# Patient Record
Sex: Male | Born: 1990 | Race: Black or African American | Hispanic: No | Marital: Single | Smoking: Never smoker
Health system: Southern US, Community
[De-identification: ages and names within clinical notes are randomized; demographics above are authoritative.]

## PROBLEM LIST (undated history)

## (undated) DIAGNOSIS — F329 Major depressive disorder, single episode, unspecified: Secondary | ICD-10-CM

## (undated) DIAGNOSIS — J45909 Unspecified asthma, uncomplicated: Secondary | ICD-10-CM

## (undated) DIAGNOSIS — F32A Depression, unspecified: Secondary | ICD-10-CM

## (undated) DIAGNOSIS — F419 Anxiety disorder, unspecified: Secondary | ICD-10-CM

---

## 2014-10-24 ENCOUNTER — Emergency Department: Payer: BLUE CROSS/BLUE SHIELD

## 2014-10-24 ENCOUNTER — Encounter: Payer: Self-pay | Admitting: Emergency Medicine

## 2014-10-24 ENCOUNTER — Emergency Department
Admission: EM | Admit: 2014-10-24 | Discharge: 2014-10-24 | Disposition: A | Discharge status: Home / Self Care | Payer: BLUE CROSS/BLUE SHIELD | Attending: Emergency Medicine | Admitting: Emergency Medicine

## 2014-10-24 DIAGNOSIS — R112 Nausea with vomiting, unspecified: Secondary | ICD-10-CM | POA: Diagnosis present

## 2014-10-24 DIAGNOSIS — R1013 Epigastric pain: Secondary | ICD-10-CM

## 2014-10-24 DIAGNOSIS — Z79899 Other long term (current) drug therapy: Secondary | ICD-10-CM | POA: Diagnosis not present

## 2014-10-24 HISTORY — DX: Depression, unspecified: F32.A

## 2014-10-24 HISTORY — DX: Unspecified asthma, uncomplicated: J45.909

## 2014-10-24 HISTORY — DX: Anxiety disorder, unspecified: F41.9

## 2014-10-24 HISTORY — DX: Major depressive disorder, single episode, unspecified: F32.9

## 2014-10-24 LAB — COMPREHENSIVE METABOLIC PANEL
ALK PHOS: 51 U/L (ref 38–126)
ALT: 19 U/L (ref 17–63)
AST: 26 U/L (ref 15–41)
Albumin: 4.4 g/dL (ref 3.5–5.0)
Anion gap: 6 (ref 5–15)
BUN: 13 mg/dL (ref 6–20)
CALCIUM: 9.2 mg/dL (ref 8.9–10.3)
CO2: 29 mmol/L (ref 22–32)
CREATININE: 1.08 mg/dL (ref 0.61–1.24)
Chloride: 103 mmol/L (ref 101–111)
GFR calc non Af Amer: >60 mL/min (ref >60)
GLUCOSE: 95 mg/dL (ref 65–99)
Potassium: 3.8 mmol/L (ref 3.5–5.1)
SODIUM: 138 mmol/L (ref 135–145)
Total Bilirubin: 0.7 mg/dL (ref 0.3–1.2)
Total Protein: 7.3 g/dL (ref 6.5–8.1)

## 2014-10-24 LAB — CBC
HCT: 40.2 % (ref 40.0–52.0)
Hemoglobin: 13.2 g/dL (ref 13.0–18.0)
MCH: 30.9 pg (ref 26.0–34.0)
MCHC: 32.9 g/dL (ref 32.0–36.0)
MCV: 94.1 fL (ref 80.0–100.0)
PLATELETS: 219 10*3/uL (ref 150–440)
RBC: 4.27 MIL/uL — AB (ref 4.40–5.90)
RDW: 13.2 % (ref 11.5–14.5)
WBC: 5.2 10*3/uL (ref 3.8–10.6)

## 2014-10-24 LAB — LIPASE, BLOOD: Lipase: 17 U/L — ABNORMAL LOW (ref 22–51)

## 2014-10-24 MED ORDER — IOHEXOL 240 MG/ML SOLN
25.0000 mL | Freq: Once | INTRAMUSCULAR | Status: AC | PRN
  Administered 2014-10-24: 25 mL via ORAL

## 2014-10-24 MED ORDER — GI COCKTAIL ~~LOC~~
30.0000 mL | Freq: Once | ORAL | Status: AC
  Administered 2014-10-24: 30 mL via ORAL
  Filled 2014-10-24: qty 30

## 2014-10-24 MED ORDER — ONDANSETRON 4 MG PO TBDP
4.0000 mg | ORAL_TABLET | Freq: Once | ORAL | Status: AC
  Administered 2014-10-24: 4 mg via ORAL
  Filled 2014-10-24: qty 1

## 2014-10-24 MED ORDER — IOHEXOL 300 MG/ML  SOLN
100.0000 mL | Freq: Once | INTRAMUSCULAR | Status: AC | PRN
  Administered 2014-10-24: 100 mL via INTRAVENOUS

## 2014-10-24 MED ORDER — ONDANSETRON 4 MG PO TBDP: 4.0000 mg | ORAL_TABLET | Freq: Four times a day (QID) | ORAL | Status: AC | PRN

## 2014-10-24 NOTE — Discharge Instructions (Signed)
Please follow up closely with your primary doctor and with gastroenterology. Please return to the emergency room right away if you develop a fever, severe or persistent abdominal pain, or vomiting and feel you cannot keep food down, you have bloody diarrhea or vomit, or other new concerns or symptoms arise. I strongly recommend close follow-up with gastroenterology. Also, please start taking Nexium over-the-counter as directed on the box.  Abdominal Pain Many things can cause abdominal pain. Usually, abdominal pain is not caused by a disease and will improve without treatment. It can often be observed and treated at home. Your health care provider will do a physical exam and possibly order blood tests and X-rays to help determine the seriousness of your pain. However, in many cases, more time must pass before a clear cause of the pain can be found. Before that point, your health care provider may not know if you need more testing or further treatment. HOME CARE INSTRUCTIONS  Monitor your abdominal pain for any changes. The following actions may help to alleviate any discomfort you are experiencing:  Only take over-the-counter or prescription medicines as directed by your health care provider.  Do not take laxatives unless directed to do so by your health care provider.  Try a clear liquid diet (broth, tea, or water) as directed by your health care provider. Slowly move to a bland diet as tolerated. SEEK MEDICAL CARE IF:  You have unexplained abdominal pain.  You have abdominal pain associated with nausea or diarrhea.  You have pain when you urinate or have a bowel movement.  You experience abdominal pain that wakes you in the night.  You have abdominal pain that is worsened or improved by eating food.  You have abdominal pain that is worsened with eating fatty foods.  You have a fever. SEEK IMMEDIATE MEDICAL CARE IF:   Your pain does not go away within 2 hours.  You keep throwing up  (vomiting).  Your pain is felt only in portions of the abdomen, such as the right side or the left lower portion of the abdomen.  You pass bloody or black tarry stools. MAKE SURE YOU:  Understand these instructions.   Will watch your condition.   Will get help right away if you are not doing well or get worse.  Document Released: 11/08/2004 Document Revised: 02/03/2013 Document Reviewed: 10/08/2012 [facility] Patient Information 2015 [place], [place]. This information is not intended to replace advice given to you by your health care provider. Make sure you discuss any questions you have with your health care provider.

## 2014-10-24 NOTE — ED Notes (Signed)
Patient to ED with report of nausea/vomiting off and on that began after he had been having some issues with reflux type symptoms. Reports nausea/vomiting symptoms for about a month.

## 2014-10-24 NOTE — ED Provider Notes (Addendum)
[facility] Emergency Department Provider Note REMINDER - THIS NOTE IS NOT A FINAL MEDICAL RECORD UNTIL IT IS SIGNED. UNTIL THEN, THE CONTENT BELOW MAY REFLECT INFORMATION FROM A DOCUMENTATION TEMPLATE, NOT THE ACTUAL PATIENT VISIT. ____________________________________________  Time seen: Approximately 9:10 PM  I have reviewed the triage vital signs and the nursing notes.   HISTORY  Chief Complaint Emesis and Gastrophageal Reflux    HPI Pedro Johnson is a 24 y.o. male history of anxiety and depression. Patient reports since July 4 he's been having pain in the upper abdomen that comes and goes. He states it is sometimes worse with eating, feels like a crampy sensation. He reports that he has had severe nausea and has vomited at times with this, and reports he thinks he has reflux with burning feeling in the upper abdomen after vomiting. He reports he has not vomited in a few days, he is eating and drinking okay but continues to have intermittent nausea.  Denies previous significant history. Denies recent trauma. No fevers or chills. No diarrhea. No bloody or black emesis.   Past Medical History  Diagnosis Date  . Asthma   . Depression   . Anxiety     There are no active problems to display for this patient.   History reviewed. No pertinent past surgical history.  Current Outpatient Rx  Name  Route  Sig  Dispense  Refill  . albuterol (PROVENTIL HFA;VENTOLIN HFA) 108 (90 BASE) MCG/ACT inhaler   Inhalation   Inhale 2 puffs into the lungs every 6 (six) hours as needed for wheezing or shortness of breath.         Marland Kitchen buPROPion (WELLBUTRIN XL) 300 MG 24 hr tablet   Oral   Take 300 mg by mouth every morning.      1   . clonazePAM (KLONOPIN) 0.5 MG tablet   Oral   Take 0.5 mg by mouth 3 (three) times daily.      1   . escitalopram (LEXAPRO) 5 MG tablet   Oral   Take 5 mg by mouth daily.      0   . gabapentin (NEURONTIN) 300 MG capsule   Oral   Take 300 mg by mouth 2 (two) times daily.      0   . ondansetron (ZOFRAN ODT) 4 MG disintegrating tablet   Oral   Take 1 tablet (4 mg total) by mouth every 6 (six) hours as needed for nausea or vomiting.   20 tablet   0     Allergies Sulfa antibiotics  History reviewed. No pertinent family history.  Social History Social History  Substance Use Topics  . Smoking status: Never Smoker   . Smokeless tobacco: None  . Alcohol Use: Yes    Review of Systems Constitutional: No fever/chills Eyes: No visual changes. ENT: No sore throat. Cardiovascular: Denies chest pain. Respiratory: Denies shortness of breath. Gastrointestinal:   No diarrhea.  No constipation. No bloody stools. Genitourinary: Negative for dysuria. Musculoskeletal: Negative for back pain. No flank pain. Skin: Negative for rash. Neurological: Negative for headaches, focal weakness or numbness.  10-point ROS otherwise negative.  ____________________________________________   PHYSICAL EXAM:  VITAL SIGNS: ED Triage Vitals  Enc Vitals Group     BP 10/24/14 1809 132/85 mmHg     Pulse Rate 10/24/14 1809 72     Resp 10/24/14 1809 20     Temp 10/24/14 1809 98.6 F (37 C)     Temp Source 10/24/14 1809 Oral  SpO2 10/24/14 1809 99 %     Weight 10/24/14 1809 200 lb (90.719 kg)     Height 10/24/14 1809 6' (1.829 m)     Head Cir --      Peak Flow --      Pain Score 10/24/14 1810 10     Pain Loc --      Pain Edu? --      Excl. in GC? --    Constitutional: Alert and oriented. Well appearing and in no acute distress. Well built. Very amicable and in no distress. Eyes: Conjunctivae are normal. PERRL. EOMI. Head: Atraumatic. Nose: No congestion/rhinnorhea. Mouth/Throat: Mucous membranes are moist.  Oropharynx non-erythematous. Neck: No stridor.   Cardiovascular: Normal rate, regular rhythm. Grossly normal heart sounds.  Good peripheral circulation. Respiratory: Normal respiratory effort.  No retractions.  Lungs CTAB. Gastrointestinal: Soft and nontender except for some mild discomfort in the epigastrium. No distention. No abdominal bruits. No CVA tenderness. No pain in McBurney's point. Negative Murphy. Musculoskeletal: No lower extremity tenderness nor edema.  No joint effusions. Neurologic:  Normal speech and language. No gross focal neurologic deficits are appreciated. No gait instability. Skin:  Skin is warm, dry and intact. No rash noted. Psychiatric: Mood and affect are normal. Speech and behavior are normal.  ____________________________________________   LABS (all labs ordered are listed, but only abnormal results are displayed)  Labs Reviewed  LIPASE, BLOOD - Abnormal; Notable for the following:    Lipase 17 (*)    All other components within normal limits  CBC - Abnormal; Notable for the following:    RBC 4.27 (*)    All other components within normal limits  COMPREHENSIVE METABOLIC PANEL   ____________________________________________  EKG  ED ECG REPORT I, Michi Herrmann, the attending physician, personally viewed and interpreted this ECG.  Date: 10/24/2014 EKG Time: 2030 Rate: 70 Rhythm: normal sinus rhythm QRS Axis: normal Intervals: normal ST/T Wave abnormalities: normal Conduction Disutrbances: Right bundle branch block incomplete Narrative Interpretation: unremarkable except for incomplete right bundle-branch block. No ischemic change.  ____________________________________________  RADIOLOGY  Ultrasound right upper quadrant pending at time of sign out to Dr. Langston Masker ____________________________________________   PROCEDURES  Procedure(s) performed: None  Critical Care performed: No  ____________________________________________   INITIAL IMPRESSION / ASSESSMENT AND PLAN / ED COURSE  Pertinent labs & imaging results that were available during my care of the patient were reviewed by me and considered in my medical decision making (see chart for  details).  Patient presents with 3+ months of nausea with occasional vomiting. His exam is very reassuring. He has no acute cardiac or pulmonary symptoms. He is afebrile and in no distress. His abdominal exam is essentially normal except for some mild discomfort to palpation in the deep epigastrium. He does have reflux type symptoms. Because of the intermittent nature and epigastric location I have ordered an ultrasound to evaluate for cholelithiasis. Should this be negative, I discussed with the patient treatment fever over-the-counter Nexium and prescription Zofran with close follow-up advised with his primary care doctor and gastroenterology. We did discuss CT scan to rule out additional causes such as very unlikely but not impossible tumor, or infection though I highly doubt it based on exam. With shared medical decision making and discussions of the risks of radiation which are very low, but not 0 patient does not wish to have this but instead will follow-up closely with good return precautions.  Care and disposition assigned to Dr. Langston Masker. He will follow-up on ultrasound,  should this be negative and is to discharge the patient to follow-up with gastroenterology.  I advised the patient on careful abdominal pain return precautions including any fever, worsening of pain, bloody emesis, dehydration, other new concerns. ____________________________________________   FINAL CLINICAL IMPRESSION(S) / ED DIAGNOSES  Final diagnoses:  Epigastric pain      Sharyn Creamer, MD 10/24/14 2114  ----------------------------------------- 9:35 PM on 10/24/2014 -----------------------------------------  The patient is adamant that he needs a CT scan, and I discussed the risks and benefits with him extensively. He reports that his mother is a physician in [place], and that he must have a CT scan today. Based on the patient's significant request, I have ordered a CT and canceled his ultrasound. Dr. Langston Masker of  follow-up on CT of pelvis. Risks of radiation, IV contrast, allergic reaction, future cancers all discussed with the patient to his excepting.  Sharyn Creamer, MD 10/24/14 2136

## 2014-10-24 NOTE — ED Provider Notes (Signed)
Received signout from Dr. Fanny Bien. Plan is to follow up with the patient's CAT scan of his abdomen and pelvis. Patient has a history of reflux and of the skin is negative the patient likely has symptoms related to this. Physical Exam  BP 134/72 mmHg  Pulse 71  Temp(Src) 98.6 F (37 C) (Oral)  Resp 20  Ht 6' (1.829 m)  Wt 200 lb (90.719 kg)  BMI 27.12 kg/m2  SpO2 100% ----------------------------------------- 10:39 PM on 10/24/2014 -----------------------------------------   Physical Exam Patient resting comfortable with this time. Abdomen is soft with minimal tenderness in the epigastrium. There is a negative Murphy sign. ED Course  Procedures No acute abnormality in the abdomen and pelvis on CAT scan. MDM Patient well appearing and tolerated his by mouth contrast. Abdomen reexamined with a nonsurgical abdomen with only minimal tenderness. We'll discharge to home.      Myrna Blazer, MD 10/24/14 2240

## 2014-10-24 NOTE — ED Notes (Signed)
Lab results reviewed and WNL. Awaiting room for MD eval at this time.

## 2014-10-24 NOTE — ED Notes (Signed)
Pt has upper abd pain.  Pt reports n/v everyday since July 4.  Denies diarrhea.  No back pain.

## 2017-03-03 IMAGING — CT CT ABD-PELV W/ CM
1 of 2 series · 15 of 32 positions shown, 19 images · IV contrast (omnipaque)
Comparison: None.

CLINICAL DATA: Epigastric pain, nausea and anorexia for 3 months.
Vomiting.

EXAM:
CT ABDOMEN AND PELVIS WITH CONTRAST
TECHNIQUE: Multidetector CT imaging of the abdomen and pelvis was performed
using the standard protocol following bolus administration of
intravenous contrast.
CONTRAST:  100mL OMNIPAQUE IOHEXOL 300 MG/ML  SOLN

[Series 2: routine abd pel with · axial · 0.72mm/px · z∈[-500,-96]mm · 15 of 89 slices shown, 19 images]
[im 4/89  soft-tissue]
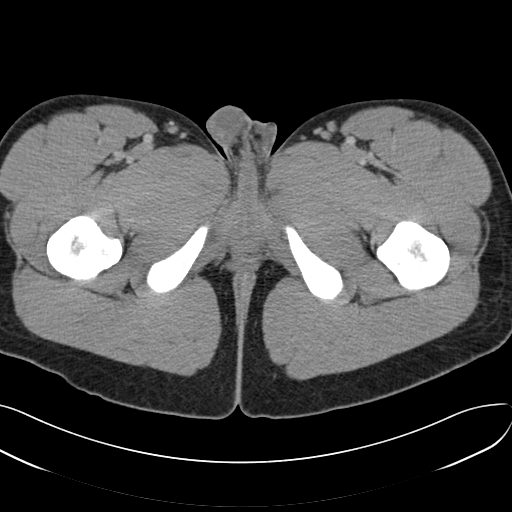
[im 4/89  bone]
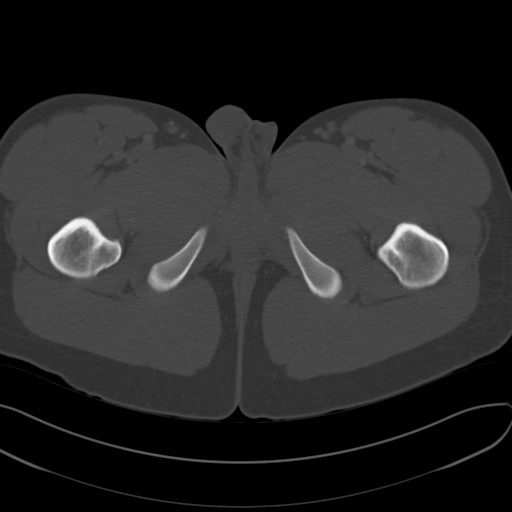
[im 12/89  soft-tissue]
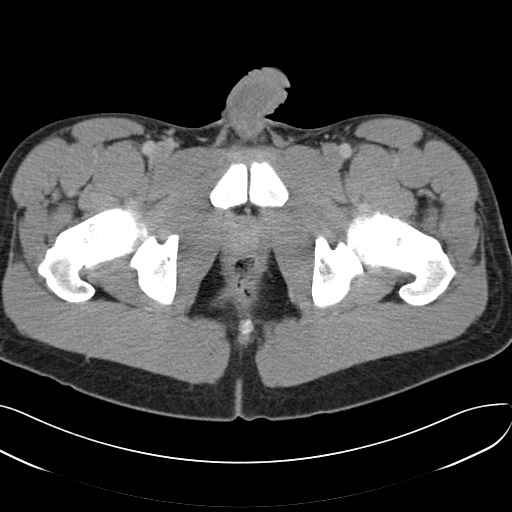
[im 19/89  soft-tissue]
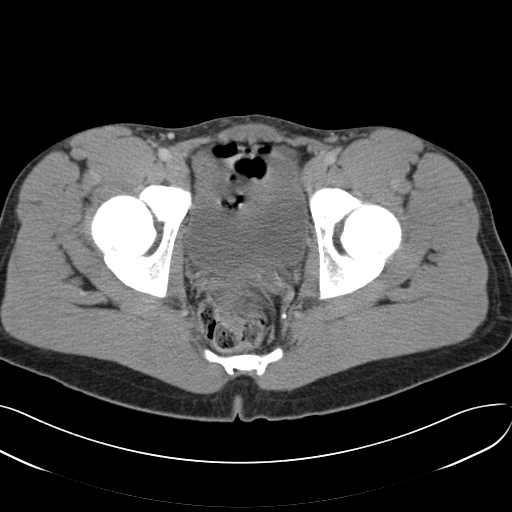
[im 26/89  soft-tissue]
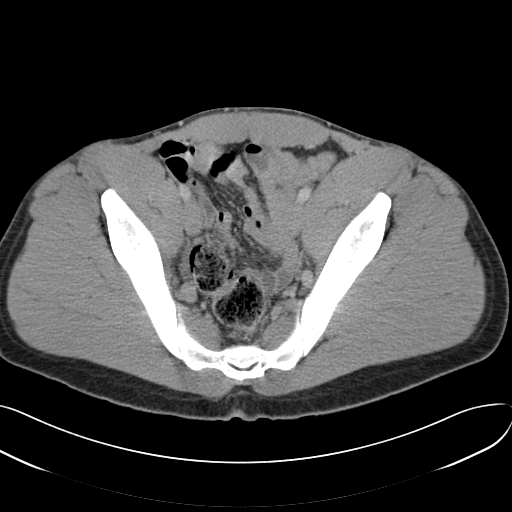
[im 30/89  soft-tissue]
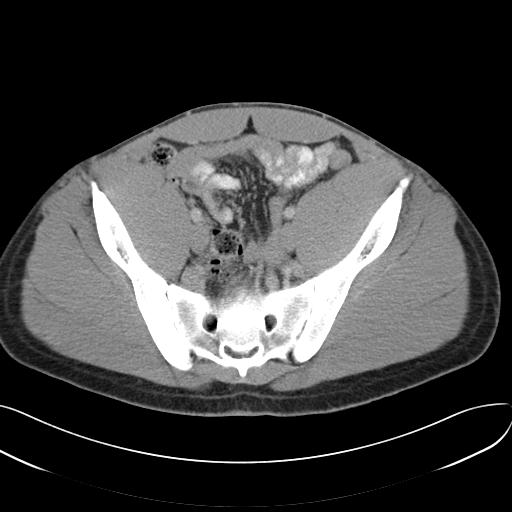
[im 37/89  soft-tissue]
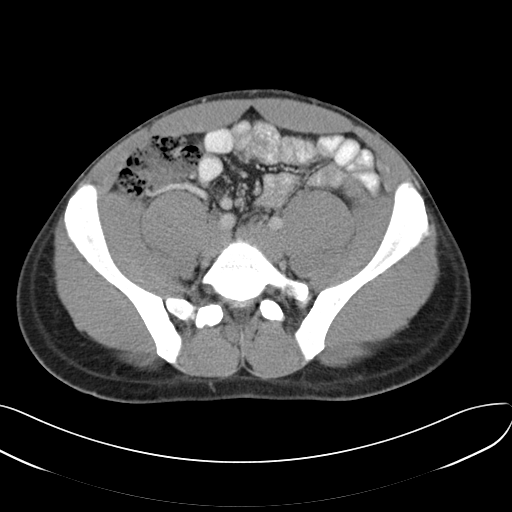
[im 45/89  soft-tissue]
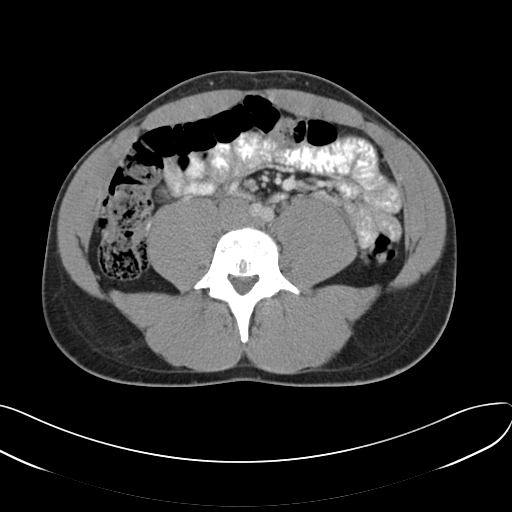
[im 52/89  soft-tissue]
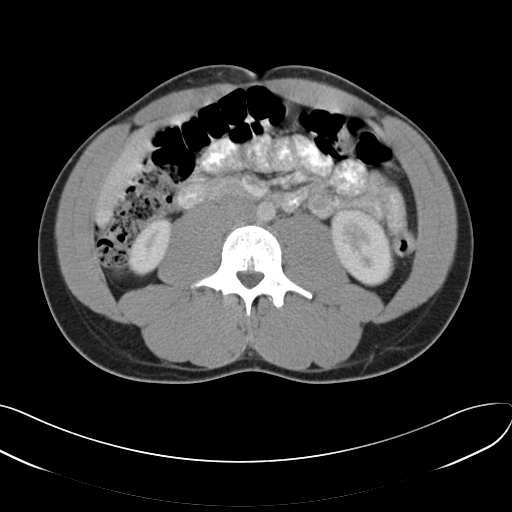
[im 59/89  soft-tissue]
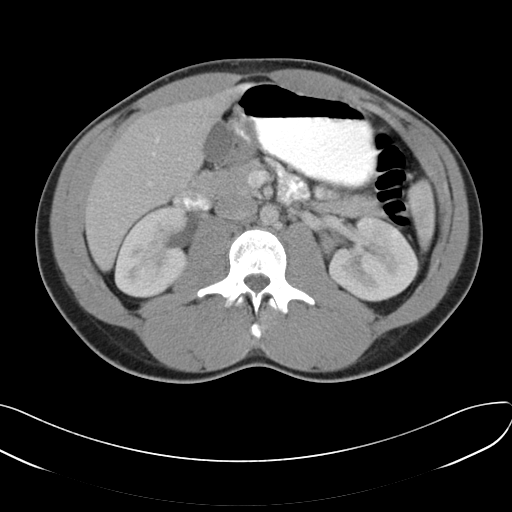
[im 59/89  bone]
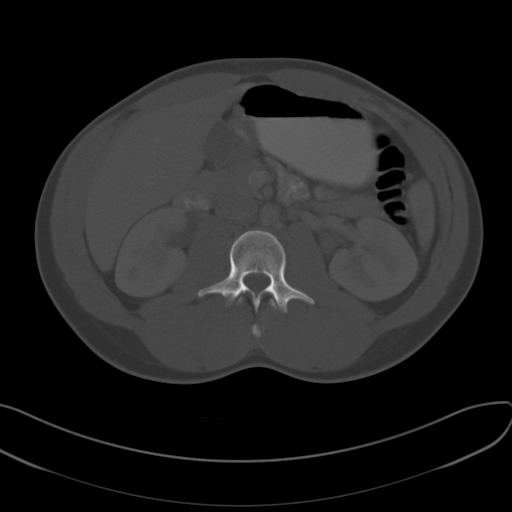
[im 63/89  soft-tissue]
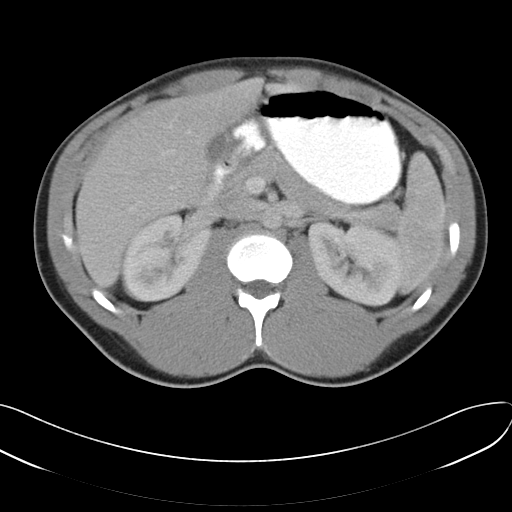
[im 70/89  soft-tissue]
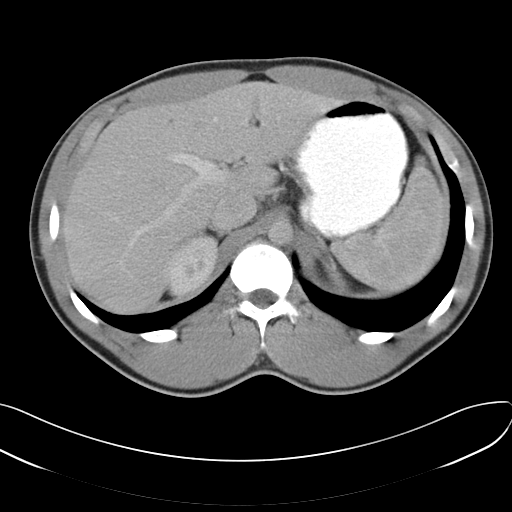
[im 74/89  lung]
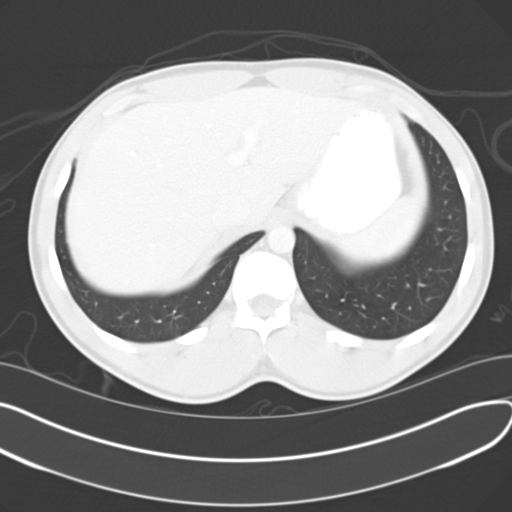
[im 78/89  soft-tissue]
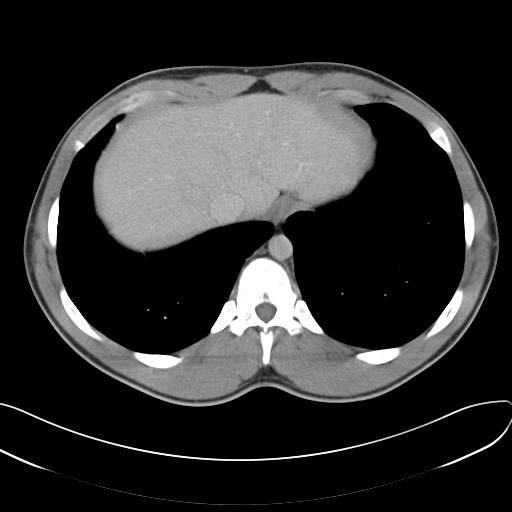
[im 78/89  lung]
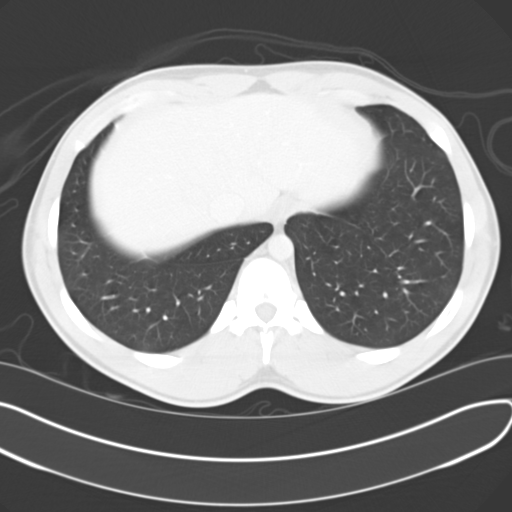
[im 81/89  lung]
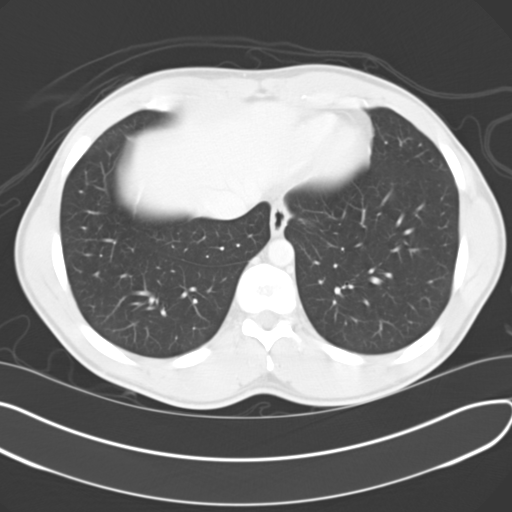
[im 85/89  soft-tissue]
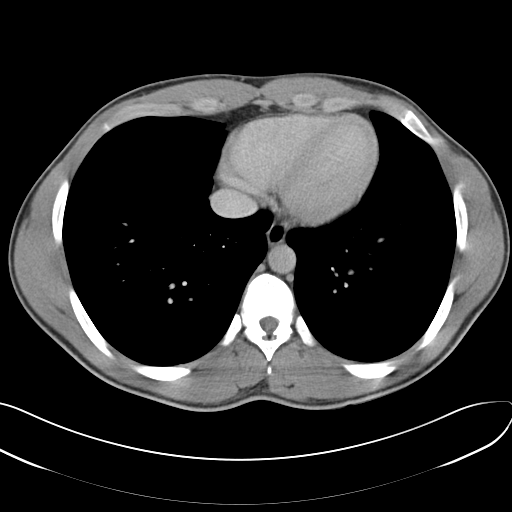
[im 85/89  lung]
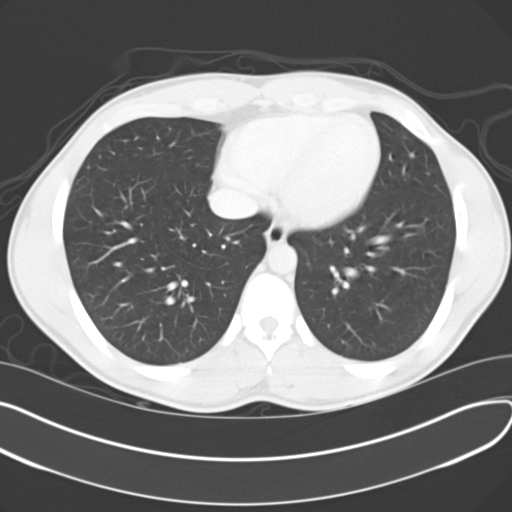

[15 of 32 positions shown; findings below may reference images not displayed]

FINDINGS: Lower chest:  The included lung bases are clear.

Liver: Tiny 3 mm hypodensity in the right lobe of the liver, of
doubtful clinical significance. Liver is otherwise normal.

Hepatobiliary: The gallbladder is physiologically distended. No
intra or extrahepatic biliary ductal dilatation.

Pancreas: Normal.

Spleen: Normal.

Adrenal glands: No nodule.

Kidneys: Symmetric renal enhancement. No hydronephrosis. No
hydronephrosis or evident renal stone.

Stomach/Bowel: Stomach physiologically distended. There are no
dilated or thickened small bowel loops. Small volume of stool
throughout the colon without colonic wall thickening. The appendix
is normal.

Vascular/Lymphatic: No retroperitoneal adenopathy. Abdominal aorta
is normal in caliber.

Reproductive: Prostate gland is normal in size.

Bladder: Physiologically distended without wall thickening.

Other: No free air, free fluid, or intra-abdominal fluid collection.

Musculoskeletal: There are no acute or suspicious osseous
abnormalities. Incidental note of hemi transitional lumbosacral
anatomy.
IMPRESSION: No acute abnormality in the abdomen/pelvis.
